# Patient Record
Sex: Female | Born: 1990 | Race: Black or African American | Hispanic: No | Marital: Single | Smoking: Never smoker
Health system: Southern US, Community
[De-identification: ages and names within clinical notes are randomized; demographics above are authoritative.]

---
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
## 2013-11-21 ENCOUNTER — Encounter (HOSPITAL_COMMUNITY): Payer: Self-pay | Admitting: Emergency Medicine

## 2013-11-21 ENCOUNTER — Emergency Department (HOSPITAL_COMMUNITY): Payer: Self-pay

## 2013-11-21 ENCOUNTER — Emergency Department (HOSPITAL_COMMUNITY)
Admission: EM | Admit: 2013-11-21 | Discharge: 2013-11-21 | Disposition: A | Discharge status: Home / Self Care | Payer: Self-pay | Attending: Emergency Medicine | Admitting: Emergency Medicine

## 2013-11-21 DIAGNOSIS — Z3202 Encounter for pregnancy test, result negative: Secondary | ICD-10-CM | POA: Insufficient documentation

## 2013-11-21 DIAGNOSIS — F17200 Nicotine dependence, unspecified, uncomplicated: Secondary | ICD-10-CM | POA: Insufficient documentation

## 2013-11-21 DIAGNOSIS — E876 Hypokalemia: Secondary | ICD-10-CM

## 2013-11-21 DIAGNOSIS — N760 Acute vaginitis: Secondary | ICD-10-CM | POA: Insufficient documentation

## 2013-11-21 DIAGNOSIS — A499 Bacterial infection, unspecified: Secondary | ICD-10-CM | POA: Insufficient documentation

## 2013-11-21 DIAGNOSIS — B9689 Other specified bacterial agents as the cause of diseases classified elsewhere: Secondary | ICD-10-CM

## 2013-11-21 DIAGNOSIS — R1031 Right lower quadrant pain: Secondary | ICD-10-CM

## 2013-11-21 LAB — COMPREHENSIVE METABOLIC PANEL
ALBUMIN: 4.5 g/dL (ref 3.5–5.2)
ALK PHOS: 79 U/L (ref 39–117)
ALT: 7 U/L (ref 0–35)
AST: 13 U/L (ref 0–37)
Anion gap: 15 (ref 5–15)
BILIRUBIN TOTAL: 0.4 mg/dL (ref 0.3–1.2)
BUN: 12 mg/dL (ref 6–23)
CHLORIDE: 98 meq/L (ref 96–112)
CO2: 25 meq/L (ref 19–32)
CREATININE: 0.69 mg/dL (ref 0.50–1.10)
Calcium: 9.5 mg/dL (ref 8.4–10.5)
GFR calc Af Amer: >90 mL/min (ref >90)
Glucose, Bld: 118 mg/dL — ABNORMAL HIGH (ref 70–99)
POTASSIUM: 3.2 meq/L — AB (ref 3.7–5.3)
SODIUM: 138 meq/L (ref 137–147)
Total Protein: 8.9 g/dL — ABNORMAL HIGH (ref 6.0–8.3)

## 2013-11-21 LAB — URINALYSIS, ROUTINE W REFLEX MICROSCOPIC
BILIRUBIN URINE: NEGATIVE
GLUCOSE, UA: NEGATIVE mg/dL
Ketones, ur: NEGATIVE mg/dL
Nitrite: NEGATIVE
PH: 8 (ref 5.0–8.0)
Protein, ur: 100 mg/dL — AB
SPECIFIC GRAVITY, URINE: 1.013 (ref 1.005–1.030)
UROBILINOGEN UA: 1 mg/dL (ref 0.0–1.0)

## 2013-11-21 LAB — CBC WITH DIFFERENTIAL/PLATELET
BASOS ABS: 0 10*3/uL (ref 0.0–0.1)
BASOS PCT: 0 % (ref 0–1)
Eosinophils Absolute: 0 10*3/uL (ref 0.0–0.7)
Eosinophils Relative: 0 % (ref 0–5)
HEMATOCRIT: 38.2 % (ref 36.0–46.0)
Hemoglobin: 13.1 g/dL (ref 12.0–15.0)
LYMPHS PCT: 29 % (ref 12–46)
Lymphs Abs: 2.8 10*3/uL (ref 0.7–4.0)
MCH: 29.8 pg (ref 26.0–34.0)
MCHC: 34.3 g/dL (ref 30.0–36.0)
MCV: 87 fL (ref 78.0–100.0)
MONO ABS: 0.9 10*3/uL (ref 0.1–1.0)
Monocytes Relative: 10 % (ref 3–12)
NEUTROS ABS: 6 10*3/uL (ref 1.7–7.7)
NEUTROS PCT: 61 % (ref 43–77)
Platelets: 376 10*3/uL (ref 150–400)
RBC: 4.39 MIL/uL (ref 3.87–5.11)
RDW: 12.5 % (ref 11.5–15.5)
WBC: 9.8 10*3/uL (ref 4.0–10.5)

## 2013-11-21 LAB — LIPASE, BLOOD: LIPASE: 14 U/L (ref 11–59)

## 2013-11-21 LAB — HIV ANTIBODY (ROUTINE TESTING W REFLEX): HIV 1&2 Ab, 4th Generation: NONREACTIVE

## 2013-11-21 LAB — WET PREP, GENITAL
Trich, Wet Prep: NONE SEEN
WBC WET PREP: NONE SEEN
YEAST WET PREP: NONE SEEN

## 2013-11-21 LAB — URINE MICROSCOPIC-ADD ON

## 2013-11-21 LAB — POC URINE PREG, ED: Preg Test, Ur: NEGATIVE

## 2013-11-21 LAB — RPR

## 2013-11-21 MED ORDER — POTASSIUM CHLORIDE CRYS ER 20 MEQ PO TBCR
40.0000 meq | EXTENDED_RELEASE_TABLET | Freq: Once | ORAL | Status: AC
Start: 2013-11-21 — End: 2013-11-21
  Administered 2013-11-21: 40 meq via ORAL
  Filled 2013-11-21: qty 2

## 2013-11-21 MED ORDER — METRONIDAZOLE 500 MG PO TABS: 500.0000 mg | ORAL_TABLET | Freq: Two times a day (BID) | ORAL | Status: DC

## 2013-11-21 MED ORDER — ONDANSETRON HCL 4 MG PO TABS: 4.0000 mg | ORAL_TABLET | Freq: Three times a day (TID) | ORAL | Status: DC | PRN

## 2013-11-21 MED ORDER — POTASSIUM CHLORIDE CRYS ER 20 MEQ PO TBCR: 20.0000 meq | EXTENDED_RELEASE_TABLET | Freq: Two times a day (BID) | ORAL | Status: DC

## 2013-11-21 NOTE — Progress Notes (Signed)
[facility] Community Liaison Stacy, ° °Provided pt with a list of primary care resources to help patient establish a pcp.  °

## 2013-11-21 NOTE — ED Notes (Signed)
Pt states that she has had right sided ABD pain. Pt states that that the pain radiates toward her back but is mainly located in the mid abdominal region. Denies any dysuria or hematuria. Denies vaginal discharge. No rebound tenderness noted.

## 2013-11-21 NOTE — Discharge Instructions (Signed)
Drink plenty of fluids. Take the medications as prescribed.Take ibuprofen 600 mg 4 times a day OR aleve 2 tabs OTC twice a day for pain. Return to the ED if you get fever, vomiting or constant abdominal pain.

## 2013-11-21 NOTE — ED Provider Notes (Signed)
CSN: [id number]     Arrival date & time 11/21/13  0423 History   First MD Initiated Contact with Patient 11/21/13 [phone]     Chief Complaint  Patient presents with  . Abdominal Pain     (Consider location/radiation/quality/duration/timing/severity/associated sxs/prior Treatment) HPI Patient reports yesterday she started getting some pain in her right lower quadrant that was initially off and on. However since last night it was constant. She describes the pain as dull and aching. She states nothing she does makes it feel worse. She states standing up or moving makes it feel better. She has nausea without vomiting. She denies diarrhea, coughing cough shortness of breath, fever, dysuria, hematuria, vaginal discharge but did have some frequency since she's been in the ED. She denies loss of appetite. She denies the pain starting after eating food. She states she's never had this pain before. She states her pain at its worse during the night was a 6/10, however at the time of my physical exam her pain was a 0/10.She states she was having pain at the beginning of her interview, but it left while we were talking.  Patient is G0 P0, states her last normal period was last week however she stopped seeing blood at least 4 days ago.   PCP none  History reviewed. No pertinent past medical history. History reviewed. No pertinent past surgical history. No family history on file. History  Substance Use Topics  . Smoking status: Current Some Day Smoker  . Smokeless tobacco: Never Used  . Alcohol Use: 1.5 oz/week    3 drink(s) per week     Comment: socially   Employed  Denies cigarette use  OB History   Grav Para Term Preterm Abortions TAB SAB Ect Mult Living                 Review of Systems  All other systems reviewed and are negative.     Allergies  Review of patient's allergies indicates no known allergies.  Home Medications   Prior to Admission medications   Medication Sig Start Date  End Date Taking? Authorizing Provider  metroNIDAZOLE (FLAGYL) 500 MG tablet Take 1 tablet (500 mg total) by mouth 2 (two) times daily. 11/21/13   Ward Givens, MD  ondansetron (ZOFRAN) 4 MG tablet Take 1 tablet (4 mg total) by mouth every 8 (eight) hours as needed for nausea or vomiting. 11/21/13   Ward Givens, MD  potassium chloride SA (K-DUR,KLOR-CON) 20 MEQ tablet Take 1 tablet (20 mEq total) by mouth 2 (two) times daily. 11/21/13   Ward Givens, MD   BP 159/111  Pulse 92  Temp(Src) 99.2 F (37.3 C) (Oral)  Resp 16  Ht  (1.626 m)  Wt 160 lb (72.576 kg)  BMI 27.45 kg/m2  SpO2 100%  LMP 11/13/2013  Vital signs normal except hypertension  Physical Exam  Nursing note and vitals reviewed. Constitutional: She is oriented to person, place, and time. She appears well-developed and well-nourished.  Non-toxic appearance. She does not appear ill. No distress.  HENT:  Head: Normocephalic and atraumatic.  Right Ear: External ear normal.  Left Ear: External ear normal.  Nose: Nose normal. No mucosal edema or rhinorrhea.  Mouth/Throat: Oropharynx is clear and moist and mucous membranes are normal. No dental abscesses or uvula swelling.  Eyes: Conjunctivae and EOM are normal. Pupils are equal, round, and reactive to light.  Neck: Normal range of motion and full passive range of motion without pain. Neck  supple.  Cardiovascular: Normal rate, regular rhythm and normal heart sounds.  Exam reveals no gallop and no friction rub.   No murmur heard. Pulmonary/Chest: Effort normal and breath sounds normal. No respiratory distress. She has no wheezes. She has no rhonchi. She has no rales. She exhibits no tenderness and no crepitus.  Abdominal: Soft. Normal appearance and bowel sounds are normal. She exhibits no distension. There is no tenderness. There is no rebound and no guarding.  Genitourinary:  Normal external genitalia. Moderate amount white discharge. Nontender normal-sized uterus. Nontender  ovaries without masses.  Musculoskeletal: Normal range of motion. She exhibits no edema and no tenderness.  Moves all extremities well.   Neurological: She is alert and oriented to person, place, and time. She has normal strength. No cranial nerve deficit.  Skin: Skin is warm, dry and intact. No rash noted. No erythema. No pallor.  Psychiatric: She has a normal mood and affect. Her speech is normal and behavior is normal. Her mood appears not anxious.    ED Course  Procedures (including critical care time)  Medications  potassium chloride SA (K-DUR,KLOR-CON) CR tablet 40 mEq (40 mEq Oral Given 11/21/13 0912)   Pt given her test results.   Labs Review Results for orders placed during the hospital encounter of 11/21/13  WET PREP, GENITAL      Result Value Ref Range   Yeast Wet Prep HPF POC NONE SEEN  NONE SEEN   Trich, Wet Prep NONE SEEN  NONE SEEN   Clue Cells Wet Prep HPF POC FEW (*) NONE SEEN   WBC, Wet Prep HPF POC NONE SEEN  NONE SEEN  CBC WITH DIFFERENTIAL      Result Value Ref Range   WBC 9.8  4.0 - 10.5 K/uL   RBC 4.39  3.87 - 5.11 MIL/uL   Hemoglobin 13.1  12.0 - 15.0 g/dL   HCT 81.1  91.4 - 78.2 %   MCV 87.0  78.0 - 100.0 fL   MCH 29.8  26.0 - 34.0 pg   MCHC 34.3  30.0 - 36.0 g/dL   RDW 95.6  21.3 - 08.6 %   Platelets 376  150 - 400 K/uL   Neutrophils Relative % 61  43 - 77 %   Neutro Abs 6.0  1.7 - 7.7 K/uL   Lymphocytes Relative 29  12 - 46 %   Lymphs Abs 2.8  0.7 - 4.0 K/uL   Monocytes Relative 10  3 - 12 %   Monocytes Absolute 0.9  0.1 - 1.0 K/uL   Eosinophils Relative 0  0 - 5 %   Eosinophils Absolute 0.0  0.0 - 0.7 K/uL   Basophils Relative 0  0 - 1 %   Basophils Absolute 0.0  0.0 - 0.1 K/uL  COMPREHENSIVE METABOLIC PANEL      Result Value Ref Range   Sodium 138  137 - 147 mEq/L   Potassium 3.2 (*) 3.7 - 5.3 mEq/L   Chloride 98  96 - 112 mEq/L   CO2 25  19 - 32 mEq/L   Glucose, Bld 118 (*) 70 - 99 mg/dL   BUN 12  6 - 23 mg/dL   Creatinine, Ser 5.78   0.50 - 1.10 mg/dL   Calcium 9.5  8.4 - 46.9 mg/dL   Total Protein 8.9 (*) 6.0 - 8.3 g/dL   Albumin 4.5  3.5 - 5.2 g/dL   AST 13  0 - 37 U/L   ALT 7  0 - 35  U/L   Alkaline Phosphatase 79  39 - 117 U/L   Total Bilirubin 0.4  0.3 - 1.2 mg/dL   GFR calc non Af Amer >90  >90 mL/min   GFR calc Af Amer >90  >90 mL/min   Anion gap 15  5 - 15  LIPASE, BLOOD      Result Value Ref Range   Lipase 14  11 - 59 U/L  URINALYSIS, ROUTINE W REFLEX MICROSCOPIC      Result Value Ref Range   Color, Urine YELLOW  YELLOW   APPearance CLOUDY (*) CLEAR   Specific Gravity, Urine 1.013  1.005 - 1.030   pH 8.0  5.0 - 8.0   Glucose, UA NEGATIVE  NEGATIVE mg/dL   Hgb urine dipstick LARGE (*) NEGATIVE   Bilirubin Urine NEGATIVE  NEGATIVE   Ketones, ur NEGATIVE  NEGATIVE mg/dL   Protein, ur 578 (*) NEGATIVE mg/dL   Urobilinogen, UA 1.0  0.0 - 1.0 mg/dL   Nitrite NEGATIVE  NEGATIVE   Leukocytes, UA SMALL (*) NEGATIVE  URINE MICROSCOPIC-ADD ON      Result Value Ref Range   WBC, UA 11-20  <3 WBC/hpf   RBC / HPF 21-50  <3 RBC/hpf   Bacteria, UA FEW (*) RARE  POC URINE PREG, ED      Result Value Ref Range   Preg Test, Ur NEGATIVE  NEGATIVE   Laboratory interpretation all normal except hematuria, + BV. hypokalemia     Imaging Review Ct Abdomen Pelvis Wo Contrast  11/21/2013   CLINICAL DATA:  RIGHT flank pain, hematuria, question kidney stone  EXAM: CT ABDOMEN AND PELVIS WITHOUT CONTRAST  TECHNIQUE: Multidetector CT imaging of the abdomen and pelvis was performed following the standard protocol without IV contrast. Sagittal and coronal MPR images reconstructed from axial data set. Oral contrast not administered for this indication.  COMPARISON:  None  FINDINGS: Lung bases clear.  No urinary tract calcification, hydronephrosis or ureteral dilatation.  Within limits of a nonenhanced exam, no focal abnormalities of the liver, spleen, pancreas, kidneys or adrenal glands identified.  Normal appendix.  Normal  appearing bladder, ureters, uterus, and adnexae.  Small umbilical hernia containing fat.  Stomach and bowel loops unremarkable for technique.  No mass, adenopathy, free fluid, free air or inflammatory process.  No acute osseous findings.  IMPRESSION: Small umbilical hernia containing fat.  Otherwise normal exam.   Electronically Signed   By: Ulyses Southward M.D.   On: 11/21/2013 08:18     EKG Interpretation None      MDM   Final diagnoses:  Hypokalemia  BV (bacterial vaginosis)  Right lower quadrant abdominal pain    New Prescriptions   METRONIDAZOLE (FLAGYL) 500 MG TABLET    Take 1 tablet (500 mg total) by mouth 2 (two) times daily.   ONDANSETRON (ZOFRAN) 4 MG TABLET    Take 1 tablet (4 mg total) by mouth every 8 (eight) hours as needed for nausea or vomiting.   POTASSIUM CHLORIDE SA (K-DUR,KLOR-CON) 20 MEQ TABLET    Take 1 tablet (20 mEq total) by mouth 2 (two) times daily.    Plan discharge  Devoria Albe, MD, Franz Dell, MD 11/21/13 [phone]

## 2013-11-22 LAB — GC/CHLAMYDIA PROBE AMP
CT Probe RNA: NEGATIVE
GC PROBE AMP APTIMA: NEGATIVE

## 2014-03-12 ENCOUNTER — Telehealth (HOSPITAL_BASED_OUTPATIENT_CLINIC_OR_DEPARTMENT_OTHER): Payer: Self-pay | Admitting: Emergency Medicine

## 2014-03-16 ENCOUNTER — Encounter (HOSPITAL_COMMUNITY): Payer: Self-pay | Admitting: Emergency Medicine

## 2014-03-16 ENCOUNTER — Emergency Department (HOSPITAL_COMMUNITY): Payer: Self-pay

## 2014-03-16 ENCOUNTER — Emergency Department (HOSPITAL_COMMUNITY)
Admission: EM | Admit: 2014-03-16 | Discharge: 2014-03-16 | Disposition: A | Discharge status: Home / Self Care | Payer: Self-pay | Attending: Emergency Medicine | Admitting: Emergency Medicine

## 2014-03-16 DIAGNOSIS — R0789 Other chest pain: Secondary | ICD-10-CM

## 2014-03-16 DIAGNOSIS — Z720 Tobacco use: Secondary | ICD-10-CM | POA: Insufficient documentation

## 2014-03-16 DIAGNOSIS — R9431 Abnormal electrocardiogram [ECG] [EKG]: Secondary | ICD-10-CM

## 2014-03-16 DIAGNOSIS — Z3202 Encounter for pregnancy test, result negative: Secondary | ICD-10-CM | POA: Insufficient documentation

## 2014-03-16 LAB — COMPREHENSIVE METABOLIC PANEL
ALT: 21 U/L (ref 0–35)
AST: 24 U/L (ref 0–37)
Albumin: 4.7 g/dL (ref 3.5–5.2)
Alkaline Phosphatase: 70 U/L (ref 39–117)
Anion gap: 18 — ABNORMAL HIGH (ref 5–15)
BUN: 10 mg/dL (ref 6–23)
CO2: 22 mEq/L (ref 19–32)
Calcium: 9.8 mg/dL (ref 8.4–10.5)
Chloride: 97 mEq/L (ref 96–112)
Creatinine, Ser: 0.66 mg/dL (ref 0.50–1.10)
GFR calc Af Amer: >90 mL/min (ref >90)
GFR calc non Af Amer: >90 mL/min (ref >90)
Glucose, Bld: 102 mg/dL — ABNORMAL HIGH (ref 70–99)
Potassium: 3.2 mEq/L — ABNORMAL LOW (ref 3.7–5.3)
SODIUM: 137 meq/L (ref 137–147)
TOTAL PROTEIN: 8.6 g/dL — AB (ref 6.0–8.3)
Total Bilirubin: 0.3 mg/dL (ref 0.3–1.2)

## 2014-03-16 LAB — URINALYSIS, ROUTINE W REFLEX MICROSCOPIC
Bilirubin Urine: NEGATIVE
GLUCOSE, UA: NEGATIVE mg/dL
Hgb urine dipstick: NEGATIVE
KETONES UR: 40 mg/dL — AB
LEUKOCYTES UA: NEGATIVE
NITRITE: NEGATIVE
PH: 7 (ref 5.0–8.0)
Protein, ur: NEGATIVE mg/dL
SPECIFIC GRAVITY, URINE: 1.009 (ref 1.005–1.030)
Urobilinogen, UA: 0.2 mg/dL (ref 0.0–1.0)

## 2014-03-16 LAB — D-DIMER, QUANTITATIVE (NOT AT ARMC)

## 2014-03-16 LAB — CBC
HEMATOCRIT: 38.5 % (ref 36.0–46.0)
Hemoglobin: 12.9 g/dL (ref 12.0–15.0)
MCH: 29.7 pg (ref 26.0–34.0)
MCHC: 33.5 g/dL (ref 30.0–36.0)
MCV: 88.5 fL (ref 78.0–100.0)
Platelets: 335 10*3/uL (ref 150–400)
RBC: 4.35 MIL/uL (ref 3.87–5.11)
RDW: 12.4 % (ref 11.5–15.5)
WBC: 10.6 10*3/uL — AB (ref 4.0–10.5)

## 2014-03-16 LAB — I-STAT TROPONIN, ED: Troponin i, poc: 0 ng/mL (ref 0.00–0.08)

## 2014-03-16 LAB — TSH: TSH: 1.69 u[IU]/mL (ref 0.350–4.500)

## 2014-03-16 LAB — PREGNANCY, URINE: Preg Test, Ur: NEGATIVE

## 2014-03-16 MED ORDER — IBUPROFEN 600 MG PO TABS: 600.0000 mg | ORAL_TABLET | Freq: Four times a day (QID) | ORAL | Status: DC | PRN

## 2014-03-16 MED ORDER — ASPIRIN 325 MG PO TABS
325.0000 mg | ORAL_TABLET | ORAL | Status: AC
  Administered 2014-03-16: 325 mg via ORAL
  Filled 2014-03-16: qty 1

## 2014-03-16 MED ORDER — LORAZEPAM 2 MG/ML IJ SOLN
1.0000 mg | Freq: Once | INTRAMUSCULAR | Status: AC
Start: 2014-03-16 — End: 2014-03-16
  Administered 2014-03-16: 1 mg via INTRAVENOUS
  Filled 2014-03-16: qty 1

## 2014-03-16 NOTE — ED Notes (Signed)
Pt c/o L sided CP with radiation to back. Pt states pain has been intermittent x 5 days. Pt states she has pain to L side of neck when swallowing at times. Denies n/v/d, denies [employer]

## 2014-03-16 NOTE — ED Provider Notes (Signed)
CSN: [id number]     Arrival date & time 03/16/14  0054 History   First MD Initiated Contact with Patient 03/16/14 0146     Chief Complaint  Patient presents with  . Chest Pain     (Consider location/radiation/quality/duration/timing/severity/associated sxs/prior Treatment) Patient is a 23 y.o. female presenting with chest pain. The history is provided by the patient.  Chest Pain Pain location:  Substernal area Pain quality: sharp   Pain radiates to:  Mid back Pain radiates to the back: yes   Pain severity:  Moderate Onset quality:  Sudden Duration:  1 minute Timing:  Intermittent Chronicity:  New Context: not breathing, no drug use, not lifting, no stress and no trauma   Ineffective treatments:  None tried Associated symptoms: no cough, no dizziness, no fatigue, no fever, no nausea, no palpitations, no shortness of breath and no syncope   Risk factors: no aortic disease, no birth control, no coronary artery disease, no diabetes mellitus, no Ehlers-Danlos syndrome, no immobilization, no Marfan's syndrome, not pregnant, no prior DVT/PE and no smoking     History reviewed. No pertinent past medical history. History reviewed. No pertinent past surgical history. No family history on file. History  Substance Use Topics  . Smoking status: Current Some Day Smoker  . Smokeless tobacco: Never Used  . Alcohol Use: 1.5 oz/week    3 drink(s) per week     Comment: socially   OB History    No data available     Review of Systems  Constitutional: Negative for fever and fatigue.  Respiratory: Negative for cough and shortness of breath.   Cardiovascular: Positive for chest pain. Negative for palpitations and syncope.  Gastrointestinal: Negative for nausea.  Neurological: Negative for dizziness.      Allergies  Review of patient's allergies indicates no known allergies.  Home Medications   Prior to Admission medications   Medication Sig Start Date End Date Taking? Authorizing  Provider  Aspirin-Acetaminophen-Caffeine (GOODY HEADACHE PO) Take 1 Package by mouth every 6 (six) hours as needed (pain).   Yes Historical Provider, MD  guaiFENesin (MUCINEX) 600 MG 12 hr tablet Take 600 mg by mouth 2 (two) times daily as needed for cough or to loosen phlegm.   Yes Historical Provider, MD  ibuprofen (ADVIL,MOTRIN) 600 MG tablet Take 1 tablet (600 mg total) by mouth every 6 (six) hours as needed. 03/16/14   Derwood KaplanAnkit Sabirin Baray, MD  metroNIDAZOLE (FLAGYL) 500 MG tablet Take 1 tablet (500 mg total) by mouth 2 (two) times daily. Patient not taking: Reported on 03/16/2014 11/21/13   Ward GivensIva L Knapp, MD  ondansetron (ZOFRAN) 4 MG tablet Take 1 tablet (4 mg total) by mouth every 8 (eight) hours as needed for nausea or vomiting. Patient not taking: Reported on 03/16/2014 11/21/13   Ward GivensIva L Knapp, MD  potassium chloride SA (K-DUR,KLOR-CON) 20 MEQ tablet Take 1 tablet (20 mEq total) by mouth 2 (two) times daily. Patient not taking: Reported on 03/16/2014 11/21/13   Ward GivensIva L Knapp, MD   BP 129/85 mmHg  Pulse 98  Temp(Src) 98.1 F (36.7 C) (Oral)  Resp 19  Ht 5\' 4"  (1.626 m)  Wt 160 lb (72.576 kg)  BMI 27.45 kg/m2  SpO2 99%  LMP 02/15/2014 Physical Exam  Constitutional: She is oriented to person, place, and time. She appears well-developed and well-nourished.  HENT:  Head: Normocephalic and atraumatic.  Eyes: EOM are normal. Pupils are equal, round, and reactive to light.  Neck: Neck supple.  Cardiovascular: Normal rate,  regular rhythm, normal heart sounds and intact distal pulses.   No murmur heard. Pulmonary/Chest: Effort normal. No respiratory distress.  Abdominal: Soft. She exhibits no distension. There is no tenderness. There is no rebound and no guarding.  Neurological: She is alert and oriented to person, place, and time.  Skin: Skin is warm and dry.  Nursing note and vitals reviewed.   ED Course  Procedures (including critical care time) Labs Review Labs Reviewed  CBC - Abnormal;  Notable for the following:    WBC 10.6 (*)    All other components within normal limits  COMPREHENSIVE METABOLIC PANEL - Abnormal; Notable for the following:    Potassium 3.2 (*)    Glucose, Bld 102 (*)    Total Protein 8.6 (*)    Anion gap 18 (*)    All other components within normal limits  URINALYSIS, ROUTINE W REFLEX MICROSCOPIC - Abnormal; Notable for the following:    Ketones, ur 40 (*)    All other components within normal limits  PREGNANCY, URINE  D-DIMER, QUANTITATIVE  TSH  I-STAT TROPOININ, ED    Imaging Review Dg Chest Port 1 View  03/16/2014   CLINICAL DATA:  Acute onset of left-sided chest pain and dry cough for 4 days. Initial encounter.  EXAM: PORTABLE CHEST - 1 VIEW  COMPARISON:  None.  FINDINGS: The lungs are well-aerated and clear. There is no evidence of focal opacification, pleural effusion or pneumothorax.  The cardiomediastinal silhouette is within normal limits. No acute osseous abnormalities are seen.  IMPRESSION: No acute cardiopulmonary process seen.   Electronically Signed   By: Roanna RaiderJeffery  Chang M.D.   On: 03/16/2014 02:33     EKG Interpretation   Date/Time:  Saturday March 16 2014 01:07:41 EST Ventricular Rate:  111 PR Interval:  174 QRS Duration: 68 QT Interval:  348 QTC Calculation: 473 R Axis:   78 Text Interpretation:  Sinus tachycardia Abnormal T, consider ischemia,  diffuse leads t wave inversion in inferior leads No old tracing to compare  Confirmed by Broghan Pannone, MD, Janey GentaANKIT [phone]([id number]) on 03/16/2014 1:15:33 AM      MDM   Final diagnoses:  Atypical chest pain  Abnormal finding on EKG    Pt comes in with cc of chest pain. Atypical, intermittent, non specific. She is noted to be tachycardic, and the EKD does have some T wave inversions. No personal or family hx of CAD, or premature deaths. PT is asymptomatic. Dimer and trops neg. CXR is clear. Will d.c. - advised to see pcp, and CARDS (if she has no pcp). TSH ordered, not  resulted.  Derwood KaplanAnkit Janiyah Beery, MD 03/16/14 [phone]

## 2014-03-16 NOTE — Discharge Instructions (Signed)
We saw you in the ER for the chest pain. All of our cardiac workup is normal, including labs, EKG and chest X-RAY are normal. We also screened you for blood clot, and that is normal as well. Thyroid labs dont come back immediately. We are not sure what is causing your discomfort, but we feel comfortable sending you home at this time.  The workup in the ER is not complete, and you should follow up with your primary care doctor for further evaluation. If you dont have primary doctor - please see the Cardiology doctors because of your ekg.   Chest Pain (Nonspecific) It is often hard to give a specific diagnosis for the cause of chest pain. There is always a chance that your pain could be related to something serious, such as a heart attack or a blood clot in the lungs. You need to follow up with your health care provider for further evaluation. CAUSES   Heartburn.  Pneumonia or bronchitis.  Anxiety or stress.  Inflammation around your heart (pericarditis) or lung (pleuritis or pleurisy).  A blood clot in the lung.  A collapsed lung (pneumothorax). It can develop suddenly on its own (spontaneous pneumothorax) or from trauma to the chest.  Shingles infection (herpes zoster virus). The chest wall is composed of bones, muscles, and cartilage. Any of these can be the source of the pain.  The bones can be bruised by injury.  The muscles or cartilage can be strained by coughing or overwork.  The cartilage can be affected by inflammation and become sore (costochondritis). DIAGNOSIS  Lab tests or other studies may be needed to find the cause of your pain. Your health care provider may have you take a test called an ambulatory electrocardiogram (ECG). An ECG records your heartbeat patterns over a 24-hour period. You may also have other tests, such as:  Transthoracic echocardiogram (TTE). During echocardiography, sound waves are used to evaluate how blood flows through your  heart.  Transesophageal echocardiogram (TEE).  Cardiac monitoring. This allows your health care provider to monitor your heart rate and rhythm in real time.  Holter monitor. This is a portable device that records your heartbeat and can help diagnose heart arrhythmias. It allows your health care provider to track your heart activity for several days, if needed.  Stress tests by exercise or by giving medicine that makes the heart beat faster. TREATMENT   Treatment depends on what may be causing your chest pain. Treatment may include:  Acid blockers for heartburn.  Anti-inflammatory medicine.  Pain medicine for inflammatory conditions.  Antibiotics if an infection is present.  You may be advised to change lifestyle habits. This includes stopping smoking and avoiding alcohol, caffeine, and chocolate.  You may be advised to keep your head raised (elevated) when sleeping. This reduces the chance of acid going backward from your stomach into your esophagus. Most of the time, nonspecific chest pain will improve within 2-3 days with rest and mild pain medicine.  HOME CARE INSTRUCTIONS   If antibiotics were prescribed, take them as directed. Finish them even if you start to feel better.  For the next few days, avoid physical activities that bring on chest pain. Continue physical activities as directed.  Do not use any tobacco products, including cigarettes, chewing tobacco, or electronic cigarettes.  Avoid drinking alcohol.  Only take medicine as directed by your health care provider.  Follow your health care provider's suggestions for further testing if your chest pain does not go away.  Keep any follow-up appointments you made. If you do not go to an appointment, you could develop lasting (chronic) problems with pain. If there is any problem keeping an appointment, call to reschedule. SEEK MEDICAL CARE IF:   Your chest pain does not go away, even after treatment.  You have a rash  with blisters on your chest.  You have a fever. SEEK IMMEDIATE MEDICAL CARE IF:   You have increased chest pain or pain that spreads to your arm, neck, jaw, back, or abdomen.  You have shortness of breath.  You have an increasing cough, or you cough up blood.  You have severe back or abdominal pain.  You feel nauseous or vomit.  You have severe weakness.  You faint.  You have chills. This is an emergency. Do not wait to see if the pain will go away. Get medical help at once. Call your local emergency services (911 in [place] Do not drive yourself to the hospital. MAKE SURE YOU:   Understand these instructions.  Will watch your condition.  Will get help right away if you are not doing well or get worse. Document Released: 12/23/2004 Document Revised: 03/20/2013 Document Reviewed: 10/19/2007 [facility] Patient Information 2015 [place], [place]. This information is not intended to replace advice given to you by your health care provider. Make sure you discuss any questions you have with your health care provider.

## 2014-03-16 NOTE — ED Notes (Signed)
Melissa C. In lab is adding on D-dimer

## 2014-03-16 NOTE — ED Notes (Signed)
EKG given to EDP Nanavati for review 

## 2015-01-30 ENCOUNTER — Emergency Department (HOSPITAL_COMMUNITY)
Admission: EM | Admit: 2015-01-30 | Discharge: 2015-01-30 | Disposition: A | Discharge status: Home / Self Care | Payer: Self-pay | Attending: Emergency Medicine | Admitting: Emergency Medicine

## 2015-01-30 ENCOUNTER — Encounter (HOSPITAL_COMMUNITY): Payer: Self-pay | Admitting: Emergency Medicine

## 2015-01-30 DIAGNOSIS — Z720 Tobacco use: Secondary | ICD-10-CM | POA: Insufficient documentation

## 2015-01-30 DIAGNOSIS — Z7982 Long term (current) use of aspirin: Secondary | ICD-10-CM | POA: Insufficient documentation

## 2015-01-30 DIAGNOSIS — H109 Unspecified conjunctivitis: Secondary | ICD-10-CM | POA: Insufficient documentation

## 2015-01-30 MED ORDER — TETRACAINE HCL 0.5 % OP SOLN
2.0000 [drp] | Freq: Once | OPHTHALMIC | Status: AC
  Administered 2015-01-30: 2 [drp] via OPHTHALMIC
  Filled 2015-01-30: qty 2

## 2015-01-30 MED ORDER — POLYMYXIN B-TRIMETHOPRIM 10000-0.1 UNIT/ML-% OP SOLN
1.0000 [drp] | OPHTHALMIC | Status: DC
  Administered 2015-01-30: 1 [drp] via OPHTHALMIC
  Filled 2015-01-30: qty 10

## 2015-01-30 MED ORDER — FLUORESCEIN SODIUM 1 MG OP STRP
1.0000 | ORAL_STRIP | Freq: Once | OPHTHALMIC | Status: AC
  Administered 2015-01-30: 1 via OPHTHALMIC
  Filled 2015-01-30: qty 1

## 2015-01-30 NOTE — ED Notes (Signed)
Visual acuity: right eye: 20/25, left eye: 20/30, both eyes 20/30, and no corrective lenses.

## 2015-01-30 NOTE — Discharge Instructions (Signed)
Apply 1 drop of polytrim to left eye every 4-6 hours for the next 5 days.  If no improvement follow up with eye specialist as listed.    Bacterial Conjunctivitis Bacterial conjunctivitis, commonly called pink eye, is an inflammation of the clear membrane that covers the white part of the eye (conjunctiva). The inflammation can also happen on the underside of the eyelids. The blood vessels in the conjunctiva become inflamed, causing the eye to become red or pink. Bacterial conjunctivitis may spread easily from one eye to another and from person to person (contagious).  CAUSES  Bacterial conjunctivitis is caused by bacteria. The bacteria may come from your own skin, your upper respiratory tract, or from someone else with bacterial conjunctivitis. SYMPTOMS  The normally white color of the eye or the underside of the eyelid is usually pink or red. The pink eye is usually associated with irritation, tearing, and some sensitivity to light. Bacterial conjunctivitis is often associated with a thick, yellowish discharge from the eye. The discharge may turn into a crust on the eyelids overnight, which causes your eyelids to stick together. If a discharge is present, there may also be some blurred vision in the affected eye. DIAGNOSIS  Bacterial conjunctivitis is diagnosed by your caregiver through an eye exam and the symptoms that you report. Your caregiver looks for changes in the surface tissues of your eyes, which may point to the specific type of conjunctivitis. A sample of any discharge may be collected on a cotton-tip swab if you have a severe case of conjunctivitis, if your cornea is affected, or if you keep getting repeat infections that do not respond to treatment. The sample will be sent to a lab to see if the inflammation is caused by a bacterial infection and to see if the infection will respond to antibiotic medicines. TREATMENT  1. Bacterial conjunctivitis is treated with antibiotics. Antibiotic  eyedrops are most often used. However, antibiotic ointments are also available. Antibiotics pills are sometimes used. Artificial tears or eye washes may ease discomfort. HOME CARE INSTRUCTIONS  1. To ease discomfort, apply a cool, clean washcloth to your eye for 10-20 minutes, 3-4 times a day. 2. Gently wipe away any drainage from your eye with a warm, wet washcloth or a cotton ball. 3. Wash your hands often with soap and water. Use paper towels to dry your hands. 4. Do not share towels or washcloths. This may spread the infection. 5. Change or wash your pillowcase every day. 6. You should not use eye makeup until the infection is gone. 7. Do not operate machinery or drive if your vision is blurred. 8. Stop using contact lenses. Ask your caregiver how to sterilize or replace your contacts before using them again. This depends on the type of contact lenses that you use. 9. When applying medicine to the infected eye, do not touch the edge of your eyelid with the eyedrop bottle or ointment tube. SEEK IMMEDIATE MEDICAL CARE IF:   Your infection has not improved within 3 days after beginning treatment.  You had yellow discharge from your eye and it returns.  You have increased eye pain.  Your eye redness is spreading.  Your vision becomes blurred.  You have a fever or persistent symptoms for more than 2-3 days.  You have a fever and your symptoms suddenly get worse.  You have facial pain, redness, or swelling. MAKE SURE YOU:   Understand these instructions.  Will watch your condition.  Will get help right away  if you are not doing well or get worse.   This information is not intended to replace advice given to you by your health care provider. Make sure you discuss any questions you have with your health care provider.   Document Released: 03/15/2005 Document Revised: 04/05/2014 Document Reviewed: 08/16/2011 Elsevier Interactive Patient Education 2016 [employer]  How to Use  Eye Drops and Eye Ointments HOW TO APPLY EYE DROPS Follow these steps when applying eye drops: 2. Wash your hands. 3. Tilt your head back. 4. Put a finger under your eye and use it to gently pull your lower lid downward. Keep that finger in place. 5. Using your other hand, hold the dropper between your thumb and index finger. 6. Position the dropper just over the edge of the lower lid. Hold it as close to your eye as you can without touching the dropper to your eye. 7. Steady your hand. One way to do this is to lean your index finger against your brow. 8. Look up. 9. Slowly and gently squeeze one drop of medicine into your eye. 10. Close your eye. 11. Place a finger between your lower eyelid and your nose. Press gently for 2 minutes. This increases the amount of time that the medicine is exposed to the eye. It also reduces side effects that can develop if the drop gets into the bloodstream through the nose. HOW TO APPLY EYE OINTMENTS Follow these steps when applying eye ointments: 10. Wash your hands. 11. Put a finger under your eye and use it to gently pull your lower lid downward. Keep that finger in place. 12. Using your other hand, place the tip of the tube between your thumb and index finger with the remaining fingers braced against your cheek or nose. 13. Hold the tube just over the edge of your lower lid without touching the tube to your lid or eyeball. 14. Look up. 15. Line the inner part of your lower lid with ointment. 16. Gently pull up on your upper lid and look down. This will force the ointment to spread over the surface of the eye. 17. Release the upper lid. 18. If you can, close your eyes for 1-2 minutes. Do not rub your eyes. If you applied the ointment correctly, your vision will be blurry for a few minutes. This is normal. ADDITIONAL INFORMATION  Make sure to use the eye drops or ointment as told by your health care provider.  If you have been told to use both eye  drops and an eye ointment, apply the eye drops first, then wait 3-4 minutes before you apply the ointment.  Try not to touch the tip of the dropper or tube to your eye. A dropper or tube that has touched the eye can become contaminated.   This information is not intended to replace advice given to you by your health care provider. Make sure you discuss any questions you have with your health care provider.   Document Released: 06/21/2000 Document Revised: 07/30/2014 Document Reviewed: 03/11/2014 Elsevier Interactive Patient Education [employer] Elsevier Inc.

## 2015-01-30 NOTE — ED Notes (Signed)
Pt states that she has had L eye redness for several days. Denies drainage. Alert and oriented.

## 2015-01-30 NOTE — ED Provider Notes (Signed)
CSN: [id number]     Arrival date & time 01/30/15  [id number] History   First MD Initiated Contact with Patient 01/30/15 0300     Chief Complaint  Patient presents with  . Eye Problem     (Consider location/radiation/quality/duration/timing/severity/associated sxs/prior Treatment) HPI   24 year old female presents for evaluation of left eye redness. Patient states for the past 3 days she has had persistent redness in her left eye, with itchiness, and occasional crust in the morning. She also reported mild discomfort without any significant pain. She does not wear contact lens. She denies any specific injury. No recent sick contact. No foreign object sensation. She has been using Visine home without adequate relief.  History reviewed. No pertinent past medical history. History reviewed. No pertinent past surgical history. History reviewed. No pertinent family history. Social History  Substance Use Topics  . Smoking status: Current Some Day Smoker  . Smokeless tobacco: Never Used  . Alcohol Use: 1.5 oz/week    3 drink(s) per week     Comment: socially   OB History    No data available     Review of Systems  Constitutional: Negative for fever.  HENT: Negative for ear pain.   Eyes: Positive for redness and itching. Negative for photophobia and visual disturbance.      Allergies  Review of patient's allergies indicates no known allergies.  Home Medications   Prior to Admission medications   Medication Sig Start Date End Date Taking? Authorizing Provider  Aspirin-Acetaminophen-Caffeine (GOODY HEADACHE PO) Take 1 Package by mouth every 6 (six) hours as needed (pain).    Historical Provider, MD  guaiFENesin (MUCINEX) 600 MG 12 hr tablet Take 600 mg by mouth 2 (two) times daily as needed for cough or to loosen phlegm.    Historical Provider, MD  ibuprofen (ADVIL,MOTRIN) 600 MG tablet Take 1 tablet (600 mg total) by mouth every 6 (six) hours as needed. 03/16/14   Derwood KaplanAnkit Nanavati, MD   metroNIDAZOLE (FLAGYL) 500 MG tablet Take 1 tablet (500 mg total) by mouth 2 (two) times daily. Patient not taking: Reported on 03/16/2014 11/21/13   Devoria AlbeIva Knapp, MD  ondansetron (ZOFRAN) 4 MG tablet Take 1 tablet (4 mg total) by mouth every 8 (eight) hours as needed for nausea or vomiting. Patient not taking: Reported on 03/16/2014 11/21/13   Devoria AlbeIva Knapp, MD  potassium chloride SA (K-DUR,KLOR-CON) 20 MEQ tablet Take 1 tablet (20 mEq total) by mouth 2 (two) times daily. Patient not taking: Reported on 03/16/2014 11/21/13   Devoria AlbeIva Knapp, MD   BP 160/104 mmHg  Pulse 100  Temp(Src) 97.7 F (36.5 C) (Oral)  Resp 18  SpO2 100%  LMP 01/26/2015 Physical Exam  Constitutional: She appears well-developed and well-nourished. No distress.  HENT:  Head: Atraumatic.  Eyes: EOM and lids are normal. Pupils are equal, round, and reactive to light. Lids are everted and swept, no foreign bodies found. Left eye exhibits no chemosis, no discharge, no exudate and no hordeolum. No foreign body present in the left eye. Left conjunctiva is injected. Left conjunctiva has no hemorrhage. No scleral icterus. Left eye exhibits normal extraocular motion and no nystagmus. Left pupil is round and reactive. Pupils are equal.  Slit lamp exam:      The left eye shows no corneal abrasion, no corneal flare, no corneal ulcer, no foreign body, no hyphema, no hypopyon and no fluorescein uptake.  Visual acuity: right eye: 20/25, left eye: 20/30, both eyes 20/30, and no corrective lenses.  Neck:  Neck supple.  Neurological: She is alert.  Skin: No rash noted.  Psychiatric: She has a normal mood and affect.  Nursing note and vitals reviewed.   ED Course  Procedures (including critical care time)  Pt here with L eye redness.  She has normal visual acuity, no significant crusting or discharge, no fb, no corneal abrasion and no evidence to suggest iritis/uveitis/acute angle glaucoma or other eye emergency.  This is likely viral related.   Pt and mom concern for bacterial infection, therefore polytrim prescribed.  Ophthalmology referral given as needed.      MDM   Final diagnoses:  Conjunctivitis, left eye    BP 160/104 mmHg  Pulse 100  Temp(Src) 97.7 F (36.5 C) (Oral)  Resp 18  SpO2 100%  LMP 01/26/2015     Fayrene Helper, PA-C 01/30/15 0408  April Palumbo, MD 01/30/15 [phone]

## 2015-02-04 ENCOUNTER — Encounter (HOSPITAL_COMMUNITY): Payer: Self-pay | Admitting: Emergency Medicine

## 2015-02-04 ENCOUNTER — Emergency Department (HOSPITAL_COMMUNITY)
Admission: EM | Admit: 2015-02-04 | Discharge: 2015-02-04 | Disposition: A | Discharge status: Home / Self Care | Payer: Self-pay | Attending: Emergency Medicine | Admitting: Emergency Medicine

## 2015-02-04 DIAGNOSIS — Z720 Tobacco use: Secondary | ICD-10-CM | POA: Insufficient documentation

## 2015-02-04 DIAGNOSIS — Z79899 Other long term (current) drug therapy: Secondary | ICD-10-CM | POA: Insufficient documentation

## 2015-02-04 DIAGNOSIS — H109 Unspecified conjunctivitis: Secondary | ICD-10-CM | POA: Insufficient documentation

## 2015-02-04 MED ORDER — TETRACAINE HCL 0.5 % OP SOLN
2.0000 [drp] | Freq: Once | OPHTHALMIC | Status: AC
  Administered 2015-02-04: 2 [drp] via OPHTHALMIC
  Filled 2015-02-04: qty 2

## 2015-02-04 MED ORDER — CIPROFLOXACIN HCL 0.3 % OP SOLN
1.0000 [drp] | OPHTHALMIC | Status: DC
  Administered 2015-02-04: 1 [drp] via OPHTHALMIC
  Filled 2015-02-04: qty 2.5

## 2015-02-04 MED ORDER — FLUORESCEIN SODIUM 1 MG OP STRP
1.0000 | ORAL_STRIP | Freq: Once | OPHTHALMIC | Status: AC
  Administered 2015-02-04: 1 via OPHTHALMIC
  Filled 2015-02-04: qty 1

## 2015-02-04 NOTE — Discharge Instructions (Signed)
Viral Conjunctivitis  Viral conjunctivitis is an inflammation of the clear membrane that covers the white part of your eye and the inner surface of your eyelid (conjunctiva). The inflammation is caused by a viral infection. The blood vessels in the conjunctiva become inflamed, causing the eye to become red or pink, and often itchy. Viral conjunctivitis can easily be passed from one person to another (contagious).  CAUSES   Viral conjunctivitis is caused by a virus. A virus is a type of contagious germ. It can be spread by touching objects that have been contaminated with the virus, such as doorknobs or towels.   SYMPTOMS   Symptoms of viral conjunctivitis may include:   · Eye redness.  · Tearing or watery eyes.  · Itchy eyes.  · Burning feeling in the eyes.  · Clear drainage from the eye.  · Swollen eyelids.  · A gritty feeling in the eye.  · Light sensitivity.  DIAGNOSIS   Viral conjunctivitis may be diagnosed with a medical history and physical exam. If you have discharge from your eye, the discharge may be tested to rule out other causes of conjunctivitis.   TREATMENT   Viral conjunctivitis does not respond to medicines that kill bacteria (antibiotics). Treatment for viral conjunctivitis is directed at stopping a bacterial infection from developing in addition to the viral infection. Treatment also aims to relieve your symptoms, such as itching. This may be done with antihistamine drops or other eye medicines.  HOME CARE INSTRUCTIONS  · Take medicines only as directed by your health care provider.  · Avoid touching or rubbing your eyes.  · Apply a warm, clean washcloth to your eye for 10-20 minutes, 3-4 times per day.  · If you wear contact lenses, do not wear them until the inflammation is gone and your health care provider says it is safe to wear them again. Ask your health care provider how to sterilize or replace your contact lenses before using them again. Wear glasses until you can resume wearing  contacts.  · Avoid wearing eye makeup until the inflammation is gone. Throw away any old eye cosmetics that may be contaminated.  · Change or wash your pillowcase every day.  · Do not share towels or washcloths. This may spread the infection.  · Wash your hands often with soap and water. Use paper towels to dry your hands.  · Gently wipe away any drainage from your eye with a warm, wet washcloth or a cotton ball.  · Be very careful to avoid touching the edge of the eyelid with the eye drop bottle or ointment tube when applying medicines to the affected eye. This will stop you from spreading the infection to the other eye or to other people.  SEEK MEDICAL CARE IF:   · Your symptoms do not improve with treatment.  · You have increased pain.  · Your vision becomes blurry.  · You have a fever.  · You have facial pain, redness, or swelling.  · You have new symptoms.  · Your symptoms get worse.     This information is not intended to replace advice given to you by your health care provider. Make sure you discuss any questions you have with your health care provider.     Document Released: 06/05/2002 Document Revised: 09/06/2005 Document Reviewed: 12/25/2013  Elsevier Interactive Patient Education ©2016 Elsevier Inc.    Bacterial Conjunctivitis  Bacterial conjunctivitis, commonly called pink eye, is an inflammation of the clear membrane that covers   the white part of the eye (conjunctiva). The inflammation can also happen on the underside of the eyelids. The blood vessels in the conjunctiva become inflamed, causing the eye to become red or pink. Bacterial conjunctivitis may spread easily from one eye to another and from person to person (contagious).   CAUSES   Bacterial conjunctivitis is caused by bacteria. The bacteria may come from your own skin, your upper respiratory tract, or from someone else with bacterial conjunctivitis.  SYMPTOMS   The normally white color of the eye or the underside of the eyelid is usually pink  or red. The pink eye is usually associated with irritation, tearing, and some sensitivity to light. Bacterial conjunctivitis is often associated with a thick, yellowish discharge from the eye. The discharge may turn into a crust on the eyelids overnight, which causes your eyelids to stick together. If a discharge is present, there may also be some blurred vision in the affected eye.  DIAGNOSIS   Bacterial conjunctivitis is diagnosed by your caregiver through an eye exam and the symptoms that you report. Your caregiver looks for changes in the surface tissues of your eyes, which may point to the specific type of conjunctivitis. A sample of any discharge may be collected on a cotton-tip swab if you have a severe case of conjunctivitis, if your cornea is affected, or if you keep getting repeat infections that do not respond to treatment. The sample will be sent to a lab to see if the inflammation is caused by a bacterial infection and to see if the infection will respond to antibiotic medicines.  TREATMENT   · Bacterial conjunctivitis is treated with antibiotics. Antibiotic eyedrops are most often used. However, antibiotic ointments are also available. Antibiotics pills are sometimes used. Artificial tears or eye washes may ease discomfort.  HOME CARE INSTRUCTIONS   · To ease discomfort, apply a cool, clean washcloth to your eye for 10-20 minutes, 3-4 times a day.  · Gently wipe away any drainage from your eye with a warm, wet washcloth or a cotton ball.  · Wash your hands often with soap and water. Use paper towels to dry your hands.  · Do not share towels or washcloths. This may spread the infection.  · Change or wash your pillowcase every day.  · You should not use eye makeup until the infection is gone.  · Do not operate machinery or drive if your vision is blurred.  · Stop using contact lenses. Ask your caregiver how to sterilize or replace your contacts before using them again. This depends on the type of contact  lenses that you use.  · When applying medicine to the infected eye, do not touch the edge of your eyelid with the eyedrop bottle or ointment tube.  SEEK IMMEDIATE MEDICAL CARE IF:   · Your infection has not improved within 3 days after beginning treatment.  · You had yellow discharge from your eye and it returns.  · You have increased eye pain.  · Your eye redness is spreading.  · Your vision becomes blurred.  · You have a fever or persistent symptoms for more than 2-3 days.  · You have a fever and your symptoms suddenly get worse.  · You have facial pain, redness, or swelling.  MAKE SURE YOU:   · Understand these instructions.  · Will watch your condition.  · Will get help right away if you are not doing well or get worse.     This information   is not intended to replace advice given to you by your health care provider. Make sure you discuss any questions you have with your health care provider.     Document Released: 03/15/2005 Document Revised: 04/05/2014 Document Reviewed: 08/16/2011  Elsevier Interactive Patient Education ©2016 Elsevier Inc.

## 2015-02-04 NOTE — ED Notes (Signed)
Pt states that she was recently dx with viral conjuntivitis but states that it has gotten worse and spread from the L to the R eye. Alert and oriented.

## 2015-02-04 NOTE — ED Provider Notes (Signed)
CSN: [id number]     Arrival date & time 02/04/15  0235 History   First MD Initiated Contact with Patient 02/04/15 0356     Chief Complaint  Patient presents with  . Eye Problem     (Consider location/radiation/quality/duration/timing/severity/associated sxs/prior Treatment) HPI Comments: Patient reports being seen in the emergency department on November 3 for a left eye irritation. She was diagnosed with conjunctivitis and started on Polytrim. Since then the redness and irritation in the eye has worsened and now she is experiencing symptoms in the right eye. Patient denies any significant eye pain, no change in vision.  Patient is a 24 y.o. female presenting with eye problem.  Eye Problem Associated symptoms: redness     History reviewed. No pertinent past medical history. History reviewed. No pertinent past surgical history. History reviewed. No pertinent family history. Social History  Substance Use Topics  . Smoking status: Current Some Day Smoker  . Smokeless tobacco: Never Used  . Alcohol Use: 1.5 oz/week    3 drink(s) per week     Comment: socially   OB History    No data available     Review of Systems  Eyes: Positive for redness.  All other systems reviewed and are negative.     Allergies  Review of patient's allergies indicates no known allergies.  Home Medications   Prior to Admission medications   Medication Sig Start Date End Date Taking? Authorizing Provider  Aspirin-Acetaminophen-Caffeine (GOODY HEADACHE PO) Take 1 Package by mouth every 6 (six) hours as needed (pain).   Yes Historical Provider, MD  Pseudoeph-Doxylamine-DM-APAP (NYQUIL MULTI-SYMPTOM PO) Take 30 mLs by mouth every 6 (six) hours.   Yes Historical Provider, MD  tetrahydrozoline 0.05 % ophthalmic solution Place 1 drop into both eyes 3 (three) times daily as needed (eye irritation).   Yes Historical Provider, MD   BP 152/107 mmHg  Pulse 76  Temp(Src) 97.6 F (36.4 C) (Oral)  Resp 18   SpO2 100%  LMP 01/26/2015 Physical Exam  Constitutional: She is oriented to person, place, and time. She appears well-developed and well-nourished. No distress.  HENT:  Head: Normocephalic and atraumatic.  Right Ear: Hearing normal.  Left Ear: Hearing normal.  Nose: Nose normal.  Mouth/Throat: Oropharynx is clear and moist and mucous membranes are normal.  Eyes: EOM are normal. Pupils are equal, round, and reactive to light. Right conjunctiva is injected. Right conjunctiva has no hemorrhage. Left conjunctiva is injected. Left conjunctiva has no hemorrhage.  IOP: 14 OD 18 OS  Fluorescein exam normal, no uptake, no sign of HSV keratitis, ulceration, abrasion  Neck: Normal range of motion. Neck supple.  Cardiovascular: Regular rhythm, S1 normal and S2 normal.  Exam reveals no gallop and no friction rub.   No murmur heard. Pulmonary/Chest: Effort normal and breath sounds normal. No respiratory distress. She exhibits no tenderness.  Abdominal: Soft. Normal appearance and bowel sounds are normal. There is no hepatosplenomegaly. There is no tenderness. There is no rebound, no guarding, no tenderness at McBurney's point and negative Murphy's sign. No hernia.  Musculoskeletal: Normal range of motion.  Neurological: She is alert and oriented to person, place, and time. She has normal strength. No cranial nerve deficit or sensory deficit. Coordination normal. GCS eye subscore is 4. GCS verbal subscore is 5. GCS motor subscore is 6.  Skin: Skin is warm, dry and intact. No rash noted. No cyanosis.  Psychiatric: She has a normal mood and affect. Her speech is normal and behavior is normal.  Thought content normal.  Nursing note and vitals reviewed.   ED Course  Procedures (including critical care time) Labs Review Labs Reviewed - No data to display  Imaging Review No results found. I have personally reviewed and evaluated these images and lab results as part of my medical decision-making.   EKG  Interpretation None      MDM   Final diagnoses:  None  conjunctivitis  Presents to emergency for evaluation of burning, irritation and redness of both of her eyes. She was seen in the ER several days ago and diagnosed with conjunctivitis. She was started on Polytrim. At that time symptoms were confined to the left eye. Left eye is now worse in the right eye is also irritated. Intraocular pressure was normal in both eyes. Corneal evaluation with fluorescein was normal. This could be chemical irritation from the Polytrim, will stop. Cannot rule out infectious etiology, will switch to CIPRO, follow-up with ophthalmology    Gilda Creasehristopher J Xariah Silvernail, MD 02/04/15 [phone]

## 2015-02-04 NOTE — ED Notes (Signed)
Bed: WA02 Expected date:  Expected time:  Means of arrival:  Comments: 

## 2015-02-04 NOTE — ED Notes (Signed)
Medication at bedside for MD, MD notified.

## 2015-02-04 NOTE — ED Notes (Signed)
Patient c/o pink eye to left eye a few days ago and now she believes she has it in her right eye. Left eye red and draining, right eye with small amount of pink to eye. Patient denies visual difficulties. Patient denies pain, states it feels a "little irritated" when she wipes it.

## 2016-02-17 IMAGING — CT CT ABD-PELV W/O CM
1 series · 14 of 32 positions shown, 18 images · non-contrast
Comparison: None

CLINICAL DATA: RIGHT flank pain, hematuria, question kidney stone

EXAM:
CT ABDOMEN AND PELVIS WITHOUT CONTRAST
TECHNIQUE: Multidetector CT imaging of the abdomen and pelvis was performed
following the standard protocol without IV contrast. Sagittal and
coronal MPR images reconstructed from axial data set. Oral contrast
not administered for this indication.

[Series 6: sagittal · sagittal · 0.73mm/px · 14 of 128 slices shown, 18 images]
[im 5/128  lung]
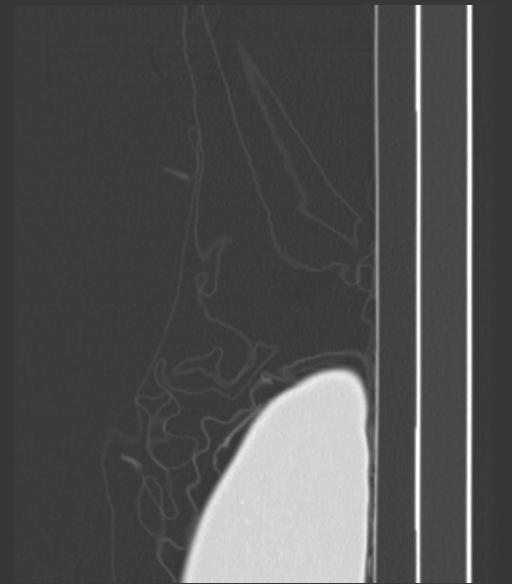
[im 9/128  soft-tissue]
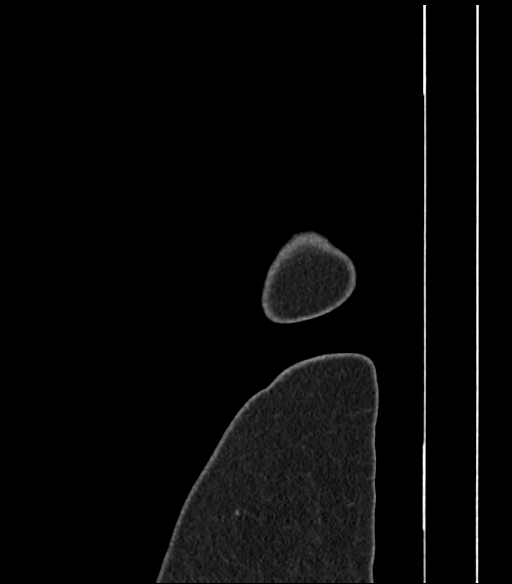
[im 9/128  lung]
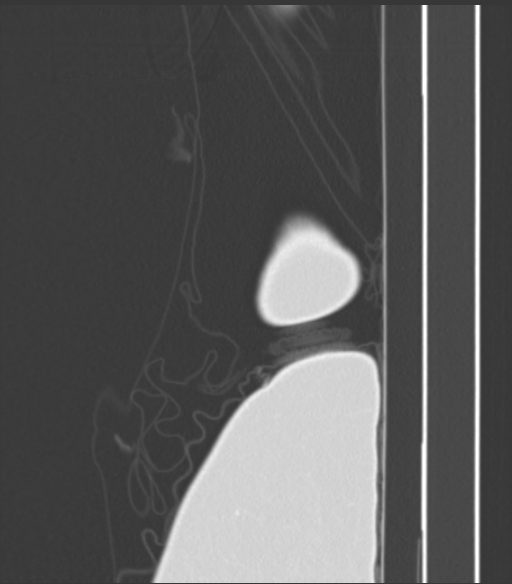
[im 9/128  bone]
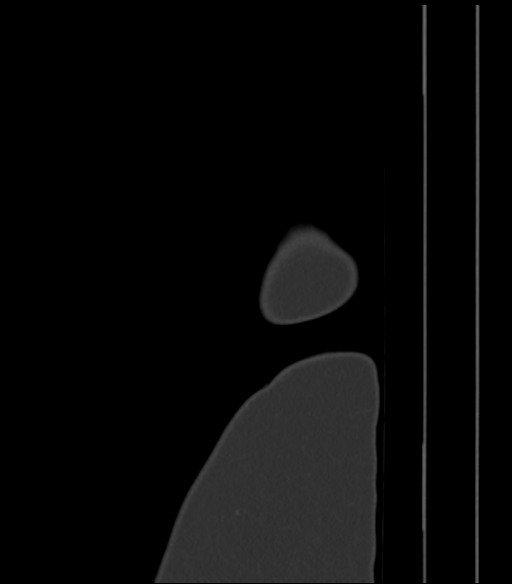
[im 13/128  lung]
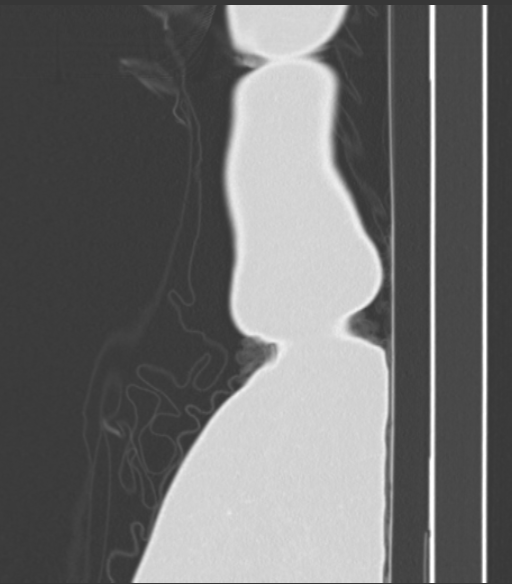
[im 17/128  soft-tissue]
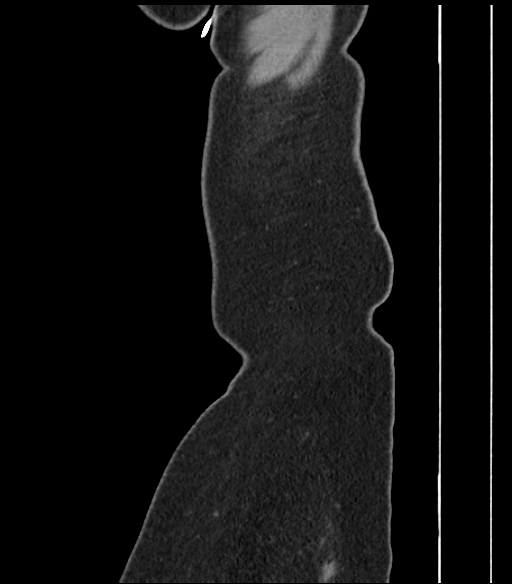
[im 17/128  lung]
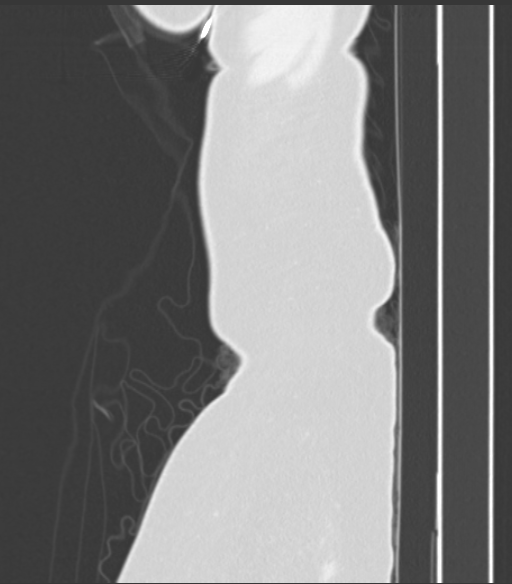
[im 29/128  soft-tissue]
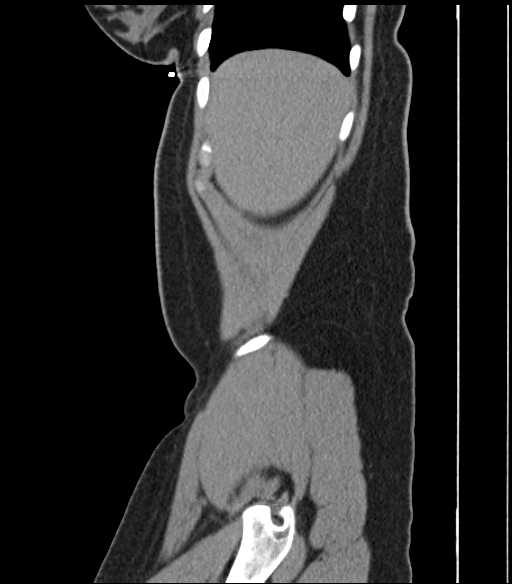
[im 37/128  soft-tissue]
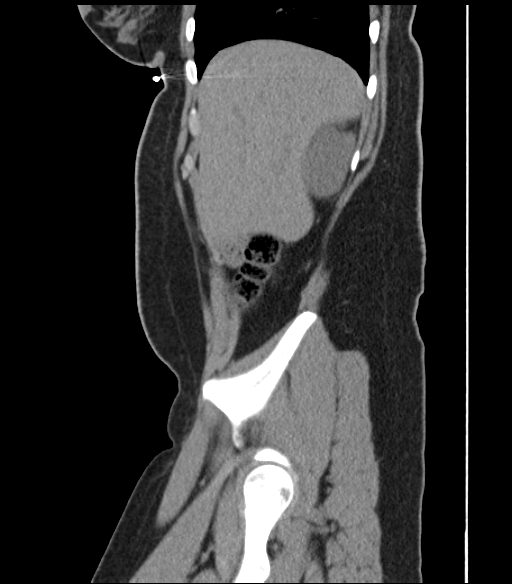
[im 50/128  soft-tissue]
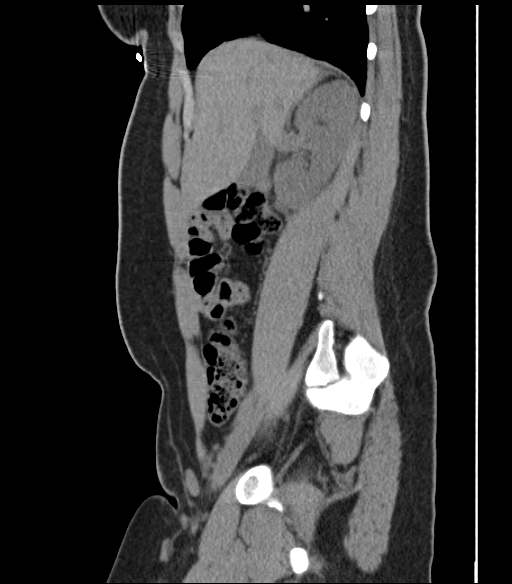
[im 58/128  soft-tissue]
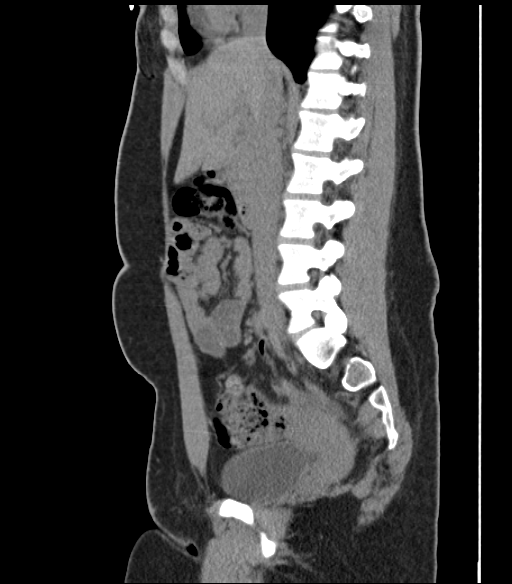
[im 70/128  soft-tissue]
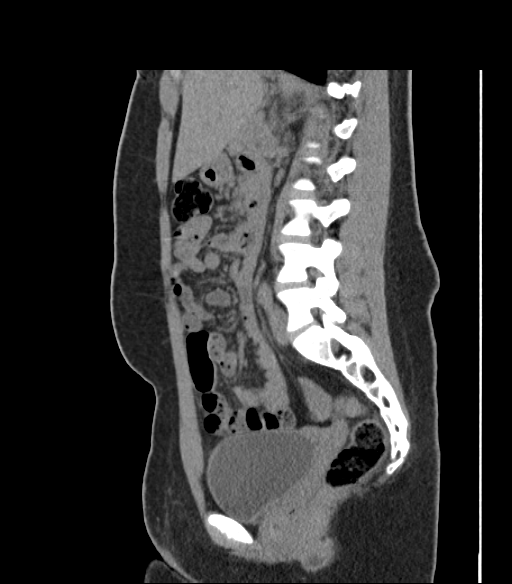
[im 78/128  soft-tissue]
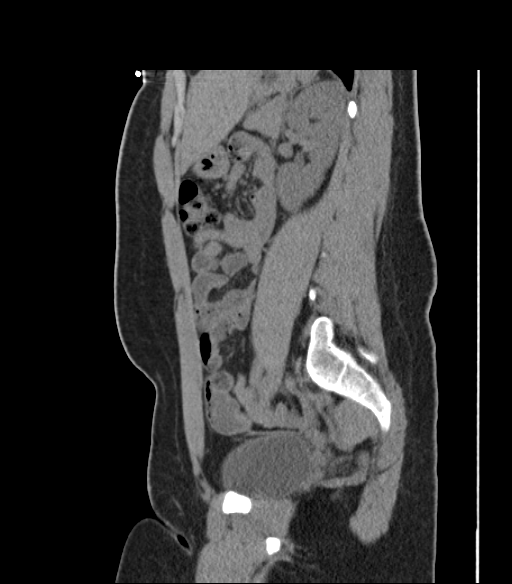
[im 91/128  soft-tissue]
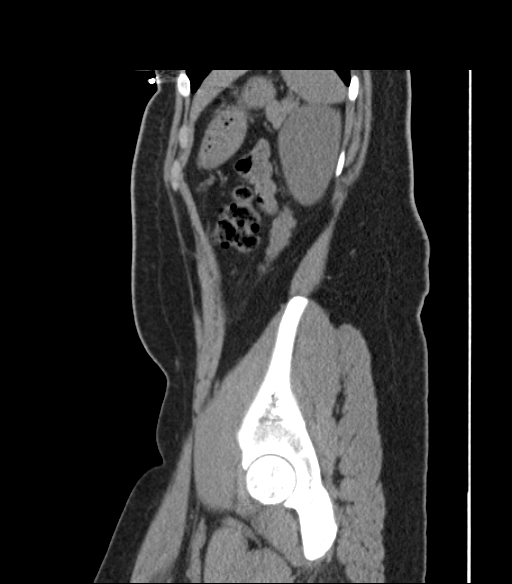
[im 91/128  bone]
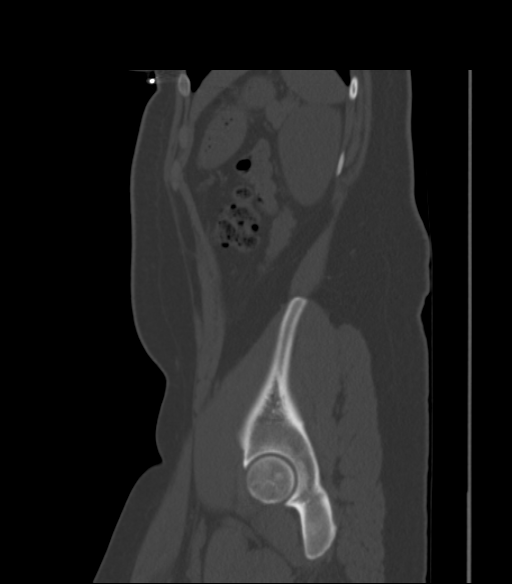
[im 99/128  soft-tissue]
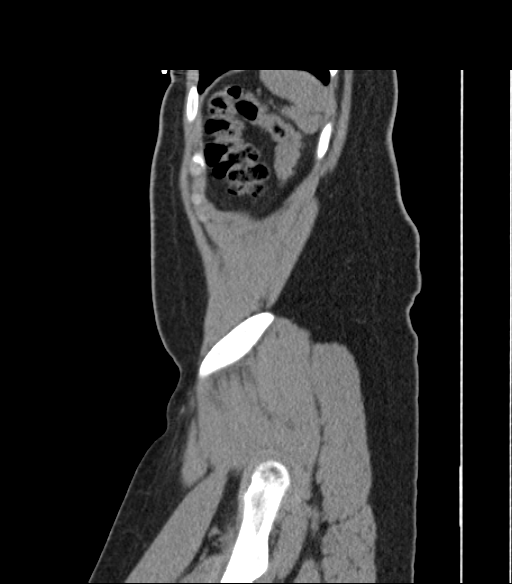
[im 111/128  soft-tissue]
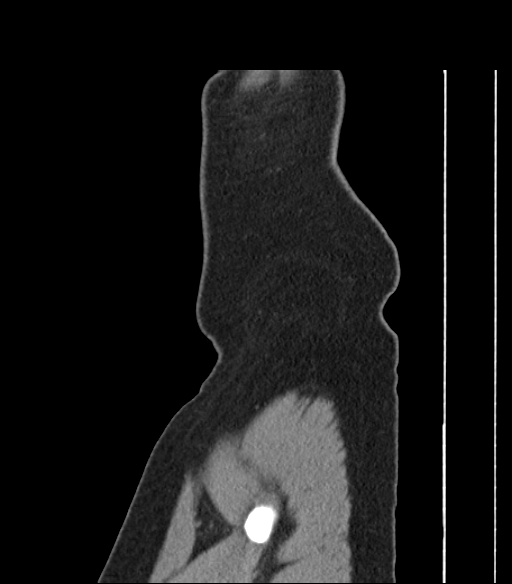
[im 119/128  soft-tissue]
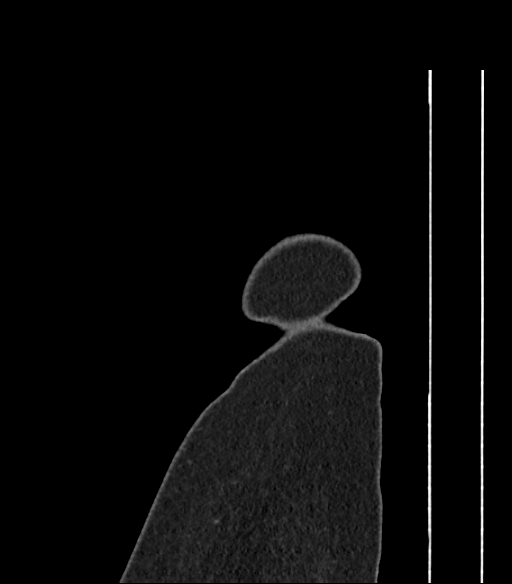

[14 of 32 positions shown; findings below may reference images not displayed]

FINDINGS: Lung bases clear.

No urinary tract calcification, hydronephrosis or ureteral
dilatation.

Within limits of a nonenhanced exam, no focal abnormalities of the
liver, spleen, pancreas, kidneys or adrenal glands identified.

Normal appendix.

Normal appearing bladder, ureters, uterus, and adnexae.

Small umbilical hernia containing fat.

Stomach and bowel loops unremarkable for technique.

No mass, adenopathy, free fluid, free air or inflammatory process.

No acute osseous findings.
IMPRESSION: Small umbilical hernia containing fat.

Otherwise normal exam.

## 2016-06-11 IMAGING — DX DG CHEST 1V PORT
1 series · 1 of 1 positions shown · non-contrast
Comparison: None.

CLINICAL DATA: Acute onset of left-sided chest pain and dry cough
for 4 days. Initial encounter.

EXAM:
PORTABLE CHEST - 1 VIEW

[AP]
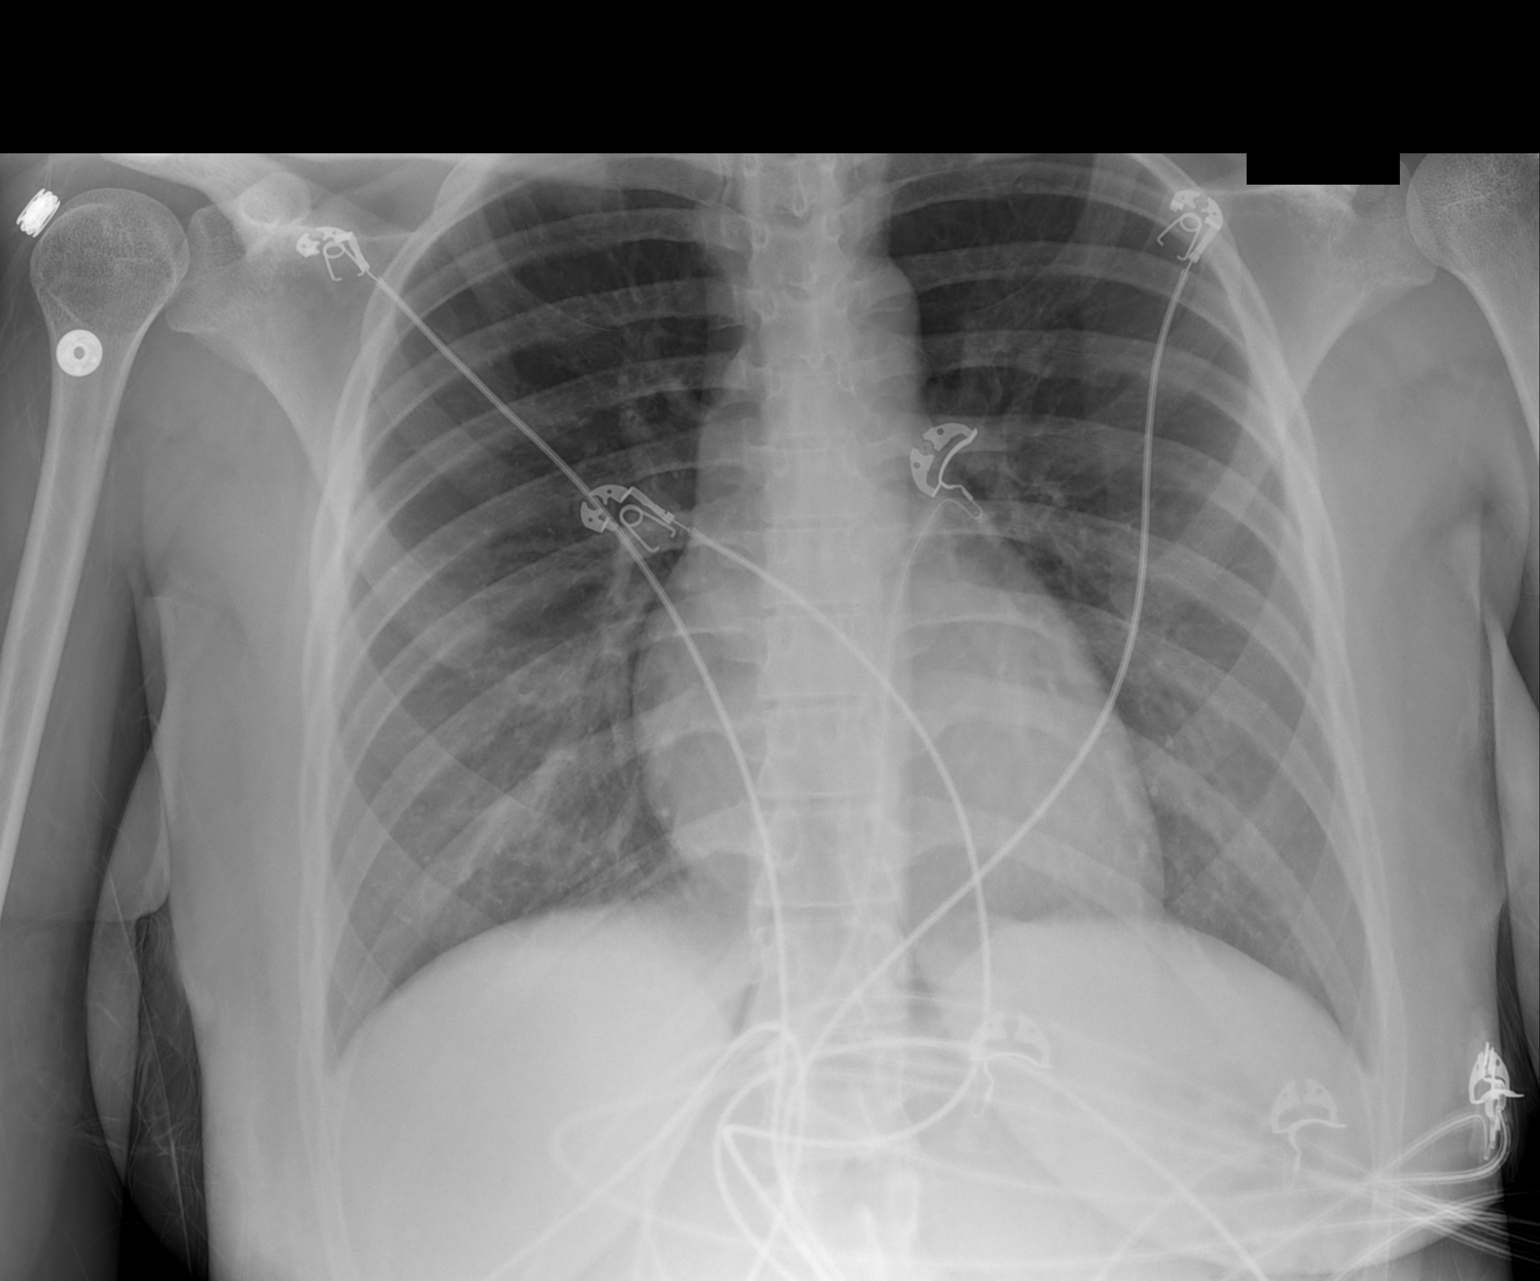

[1 of 1 positions shown; findings below may reference images not displayed]

FINDINGS: The lungs are well-aerated and clear. There is no evidence of focal
opacification, pleural effusion or pneumothorax.

The cardiomediastinal silhouette is within normal limits. No acute
osseous abnormalities are seen.
IMPRESSION: No acute cardiopulmonary process seen.

## 2016-06-14 ENCOUNTER — Encounter (HOSPITAL_COMMUNITY): Payer: Self-pay | Admitting: Emergency Medicine

## 2016-06-14 DIAGNOSIS — R1031 Right lower quadrant pain: Secondary | ICD-10-CM | POA: Insufficient documentation

## 2016-06-14 DIAGNOSIS — Z5181 Encounter for therapeutic drug level monitoring: Secondary | ICD-10-CM | POA: Insufficient documentation

## 2016-06-14 DIAGNOSIS — R000 Tachycardia, unspecified: Secondary | ICD-10-CM | POA: Insufficient documentation

## 2016-06-14 LAB — CBC
HCT: 37.1 % (ref 36.0–46.0)
Hemoglobin: 12.7 g/dL (ref 12.0–15.0)
MCH: 30 pg (ref 26.0–34.0)
MCHC: 34.2 g/dL (ref 30.0–36.0)
MCV: 87.7 fL (ref 78.0–100.0)
PLATELETS: 277 10*3/uL (ref 150–400)
RBC: 4.23 MIL/uL (ref 3.87–5.11)
RDW: 12.5 % (ref 11.5–15.5)
WBC: 6.9 10*3/uL (ref 4.0–10.5)

## 2016-06-14 LAB — URINALYSIS, ROUTINE W REFLEX MICROSCOPIC
Bilirubin Urine: NEGATIVE
GLUCOSE, UA: NEGATIVE mg/dL
Hgb urine dipstick: NEGATIVE
Ketones, ur: 5 mg/dL — AB
LEUKOCYTES UA: NEGATIVE
Nitrite: NEGATIVE
Protein, ur: NEGATIVE mg/dL
Specific Gravity, Urine: 1.016 (ref 1.005–1.030)
pH: 7 (ref 5.0–8.0)

## 2016-06-14 NOTE — ED Triage Notes (Signed)
Pt c/o right-sided abdominal pain that began 2 days ago; pt took dramamine and Tylenol which has improved her symptoms; pt states it hurts when she laughs or takes a deep breath; pt took Milk of Magnesia as well; denies N/V/D

## 2016-06-15 ENCOUNTER — Emergency Department (HOSPITAL_COMMUNITY)
Admission: EM | Admit: 2016-06-15 | Discharge: 2016-06-15 | Disposition: A | Discharge status: Home / Self Care | Payer: Self-pay | Attending: Emergency Medicine | Admitting: Emergency Medicine

## 2016-06-15 DIAGNOSIS — R000 Tachycardia, unspecified: Secondary | ICD-10-CM

## 2016-06-15 DIAGNOSIS — R1030 Lower abdominal pain, unspecified: Secondary | ICD-10-CM

## 2016-06-15 LAB — COMPREHENSIVE METABOLIC PANEL
ALT: 9 U/L — AB (ref 14–54)
ANION GAP: 10 (ref 5–15)
AST: 13 U/L — AB (ref 15–41)
Albumin: 4.2 g/dL (ref 3.5–5.0)
Alkaline Phosphatase: 54 U/L (ref 38–126)
BUN: 9 mg/dL (ref 6–20)
CALCIUM: 8.9 mg/dL (ref 8.9–10.3)
CHLORIDE: 102 mmol/L (ref 101–111)
CO2: 22 mmol/L (ref 22–32)
Creatinine, Ser: 0.65 mg/dL (ref 0.44–1.00)
GFR calc Af Amer: >60 mL/min (ref >60)
GFR calc non Af Amer: >60 mL/min (ref >60)
Glucose, Bld: 97 mg/dL (ref 65–99)
Potassium: 3.3 mmol/L — ABNORMAL LOW (ref 3.5–5.1)
Sodium: 134 mmol/L — ABNORMAL LOW (ref 135–145)
Total Bilirubin: 0.5 mg/dL (ref 0.3–1.2)
Total Protein: 8.4 g/dL — ABNORMAL HIGH (ref 6.5–8.1)

## 2016-06-15 LAB — RAPID URINE DRUG SCREEN, HOSP PERFORMED
AMPHETAMINES: NOT DETECTED
BARBITURATES: NOT DETECTED
Benzodiazepines: NOT DETECTED
Cocaine: NOT DETECTED
OPIATES: NOT DETECTED
TETRAHYDROCANNABINOL: NOT DETECTED

## 2016-06-15 LAB — I-STAT BETA HCG BLOOD, ED (MC, WL, AP ONLY)

## 2016-06-15 LAB — D-DIMER, QUANTITATIVE: D-Dimer, Quant: 0.27 ug/mL-FEU (ref 0.00–0.50)

## 2016-06-15 LAB — TSH: TSH: 2.191 u[IU]/mL (ref 0.350–4.500)

## 2016-06-15 LAB — LIPASE, BLOOD: LIPASE: 14 U/L (ref 11–51)

## 2016-06-15 NOTE — Discharge Instructions (Addendum)
We saw you in the ER for abdominal pain. All the results in the ER are normal We are not sure what is causing your symptoms. The workup in the ER is not complete, and is limited to screening for life threatening and emergent conditions only, so please see a GYNECOLOGIST for further evaluation.  Please return to the ER if your symptoms worsen; you have increased pain, fevers, chills, inability to keep any medications down, confusion, bloody stools. Otherwise see the outpatient doctor as requested.

## 2016-06-15 NOTE — ED Provider Notes (Signed)
[facility]-EMERGENCY DEPT Provider Note   CSN: [id number] Arrival date & time: 06/14/16  2246     History   Chief Complaint Chief Complaint  Patient presents with  . Abdominal Pain    HPI Melissa Stokes is a 26 y.o. female.  HPI Pt comes in with cc of abd pain. Pt reports that she has been having lower abdominal pain x 1 week, that has gotten more noticeable the last 2 days. Pain is in the RLQ, and it is not present when she is supine, but she feels the pain when she coughs or laughs. Pain is not worse with ambulation or any position. Pt has no associated nausea, emesis, diarrhea. Pt has no uti like symptoms, vaginal discharge, vaginal bleeding. She denies any risk factors for STD.  Pt is noted to be tachycardic. She thinks she is tachycardic (130s) because she is tired. Pt denies drug use. Pt has no hx of PE, DVT and denies any exogenous hormone (testosterone / estrogen) use, long distance travels or surgery in the past 6 weeks, active cancer, recent immobilization.   History reviewed. No pertinent past medical history.  There are no active problems to display for this patient.   History reviewed. No pertinent surgical history.  OB History    No data available       Home Medications    Prior to Admission medications   Not on File    Family History No family history on file.  Social History Social History  Substance Use Topics  . Smoking status: Never Smoker  . Smokeless tobacco: Never Used  . Alcohol use 1.5 oz/week    3 Standard drinks or equivalent per week     Comment: socially     Allergies   Patient has no known allergies.   Review of Systems Review of Systems  ROS 10 Systems reviewed and are negative for acute change except as noted in the HPI.    Physical Exam Updated Vital Signs BP (!) 151/104   Pulse 98   Temp 98.1 F (36.7 C) (Oral)   Resp 18   Ht 5\' 4"  (1.626 m)   Wt 150 lb (68 kg)   LMP 05/26/2016 (Exact Date)   SpO2 100%   BMI  25.75 kg/m   Physical Exam  Constitutional: She is oriented to person, place, and time. She appears well-developed and well-nourished.  HENT:  Head: Normocephalic and atraumatic.  Eyes: EOM are normal. Pupils are equal, round, and reactive to light.  Neck: Neck supple. No JVD present.  Cardiovascular: Regular rhythm and normal heart sounds.   No murmur heard. tachycardia  Pulmonary/Chest: Effort normal. No respiratory distress. She has no wheezes.  Abdominal: Soft. She exhibits no distension. There is no tenderness. There is no rebound and no guarding.  Neurological: She is alert and oriented to person, place, and time.  Skin: Skin is warm and dry.  Nursing note and vitals reviewed.    ED Treatments / Results  Labs (all labs ordered are listed, but only abnormal results are displayed) Labs Reviewed  COMPREHENSIVE METABOLIC PANEL - Abnormal; Notable for the following:       Result Value   Sodium 134 (*)    Potassium 3.3 (*)    Total Protein 8.4 (*)    AST 13 (*)    ALT 9 (*)    All other components within normal limits  URINALYSIS, ROUTINE W REFLEX MICROSCOPIC - Abnormal; Notable for the following:    Ketones, ur 5 (*)  All other components within normal limits  LIPASE, BLOOD  CBC  TSH  D-DIMER, QUANTITATIVE ([facility])  RAPID URINE DRUG SCREEN, HOSP PERFORMED  I-STAT BETA HCG BLOOD, ED ([facility], [facility], [facility] ONLY)    EKG  EKG Interpretation  Date/Time:  Tuesday June 15 2016 04:36:09 EDT Ventricular Rate:  120 PR Interval:    QRS Duration: 79 QT Interval:  316 QTC Calculation: 447 R Axis:   81 Text Interpretation:  Sinus tachycardia Probable left atrial enlargement Abnormal T, consider ischemia, anterior leads No significant change since last tracing Confirmed by Rhunette CroftNANAVATI, MD, Janey GentaANKIT ([id number]) on 06/15/2016 4:49:18 AM       Radiology No results found.  Procedures Procedures (including critical care time)  Medications Ordered in ED Medications - No data to  display   Initial Impression / Assessment and Plan / ED Course  I have reviewed the triage vital signs and the nursing notes.  Pertinent labs & imaging results that were available during my care of the patient were reviewed by me and considered in my medical decision making (see chart for details).  Clinical Course as of Jun 16 702  Tue Jun 15, 2016  0648 Results from the ER workup discussed with the patient face to face and all questions answered to the best of my ability.  Pt continues to have no pain when laying rest and on palpation.  I informed her that the only thing left is a pelvic evaluation and UDS. I will add UDS to her urine. Pt doesn't think she needs pelvic exam, she will see doctor for [facility] for further evaluation.  [AN]    Clinical Course User Index [AN] Derwood KaplanAnkit Mitchael Luckey, MD   Pt comes in with cc of abd pain. Abd pain is present only when she laughs or cough. Pain is located in the RLQ. Pt has no associated n/v. Pt has no uti like symptoms. No hx of renal stones. Pt denies that the pain is constant.  DDx includes: Colitis Tumors Intra abdominal abscess Thrombosis Mesenteric ischemia Diverticulitis Peritonitis Appendicitis Hernia Nephrolithiasis Pyelonephritis UTI/Cystitis Ovarian cyst TOA Ectopic pregnancy PID STD Pulmonary embolism  No indication for CT. WC is normal. UA is clean.  For the tachycardia - TSH, Dimer ordered.              Final Clinical Impressions(s) / ED Diagnoses   Final diagnoses:  Sinus tachycardia  Lower abdominal pain    New Prescriptions New Prescriptions   No medications on file     Derwood KaplanAnkit Anabella Capshaw, MD 06/15/16 [phone]
